# Patient Record
Sex: Male | Born: 1990 | Race: White | Hispanic: No | Marital: Married
Health system: Midwestern US, Community
[De-identification: ages and names within clinical notes are randomized; demographics above are authoritative.]

## PROBLEM LIST (undated history)

## (undated) DIAGNOSIS — K047 Periapical abscess without sinus: Secondary | ICD-10-CM

## (undated) HISTORY — PX: APPENDECTOMY: SHX54

---

## 2014-09-09 ENCOUNTER — Emergency Department (HOSPITAL_COMMUNITY)
Admission: EM | Admit: 2014-09-09 | Discharge: 2014-09-09 | Disposition: A | Discharge status: Home / Self Care | Payer: No Typology Code available for payment source | Attending: Emergency Medicine | Admitting: Emergency Medicine

## 2014-09-09 ENCOUNTER — Emergency Department (HOSPITAL_COMMUNITY): Payer: No Typology Code available for payment source

## 2014-09-09 ENCOUNTER — Encounter (HOSPITAL_COMMUNITY): Payer: Self-pay | Admitting: *Deleted

## 2014-09-09 DIAGNOSIS — Z880 Allergy status to penicillin: Secondary | ICD-10-CM | POA: Insufficient documentation

## 2014-09-09 DIAGNOSIS — Y998 Other external cause status: Secondary | ICD-10-CM | POA: Diagnosis not present

## 2014-09-09 DIAGNOSIS — S00412A Abrasion of left ear, initial encounter: Secondary | ICD-10-CM | POA: Diagnosis not present

## 2014-09-09 DIAGNOSIS — S40212A Abrasion of left shoulder, initial encounter: Secondary | ICD-10-CM | POA: Diagnosis not present

## 2014-09-09 DIAGNOSIS — S82145A Nondisplaced bicondylar fracture of left tibia, initial encounter for closed fracture: Secondary | ICD-10-CM | POA: Diagnosis not present

## 2014-09-09 DIAGNOSIS — Y92410 Unspecified street and highway as the place of occurrence of the external cause: Secondary | ICD-10-CM | POA: Insufficient documentation

## 2014-09-09 DIAGNOSIS — Z87891 Personal history of nicotine dependence: Secondary | ICD-10-CM | POA: Insufficient documentation

## 2014-09-09 DIAGNOSIS — Y9389 Activity, other specified: Secondary | ICD-10-CM | POA: Insufficient documentation

## 2014-09-09 DIAGNOSIS — S82142A Displaced bicondylar fracture of left tibia, initial encounter for closed fracture: Secondary | ICD-10-CM

## 2014-09-09 DIAGNOSIS — S8992XA Unspecified injury of left lower leg, initial encounter: Secondary | ICD-10-CM | POA: Diagnosis present

## 2014-09-09 MED ORDER — IBUPROFEN 600 MG PO TABS: 600.0000 mg | ORAL_TABLET | Freq: Four times a day (QID) | ORAL | Status: DC | PRN

## 2014-09-09 MED ORDER — IBUPROFEN 400 MG PO TABS
600.0000 mg | ORAL_TABLET | Freq: Once | ORAL | Status: AC
  Administered 2014-09-09: 600 mg via ORAL
  Filled 2014-09-09: qty 2

## 2014-09-09 MED ORDER — OXYCODONE-ACETAMINOPHEN 5-325 MG PO TABS: 1.0000 | ORAL_TABLET | ORAL | Status: DC | PRN

## 2014-09-09 MED ORDER — OXYCODONE-ACETAMINOPHEN 5-325 MG PO TABS
1.0000 | ORAL_TABLET | Freq: Once | ORAL | Status: AC
  Administered 2014-09-09: 1 via ORAL
  Filled 2014-09-09: qty 1

## 2014-09-09 MED ORDER — OXYCODONE-ACETAMINOPHEN 5-325 MG PO TABS
1.0000 | ORAL_TABLET | Freq: Once | ORAL | Status: AC
Start: 2014-09-09 — End: 2014-09-09
  Administered 2014-09-09: 1 via ORAL
  Filled 2014-09-09: qty 1
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
## 2014-09-09 NOTE — Discharge Instructions (Signed)
Tibial Plateau Fracture with Rehab The tibial plateau is the top surface of the shinbone (tibia) and is part of the knee joint. A tibial plateau fracture is a break that occurs in this portion of the shinbone. Tibial plateau fractures are fairly common. This injury is also known as a "bumper injury," because it often occurs when a person is hit by the bumper of a car.  SYMPTOMS   Severe pain at the fracture site, at the time of injury, which may continue over a period of time.  Tenderness, inflammation, and/or bruising (contusion) over the fracture site.  Decreased knee function.  Inability to stand or walk on the injured leg.  Visible deformity, if the bone fragments are not properly aligned (displaced fracture).  Signs of vascular damage: numbness and coldness below the injury site. CAUSES  Tibial plateau fractures occur when a force is placed on the bone that is greater than it can withstand. Common causes of injury include:  Direct hit (trauma) to the tibial plateau.  Indirect trauma, such as a twisting or bending injury. RISK INCREASES WITH:  Contact sports (football, rugby, lacrosse).  Motor sports.  Bone disease (osteoporosis, bone tumor).  Metabolism disorders, hormone problems, nutritional deficiency, or eating disorders (anorexia, bulimia).  Poor strength and flexibility. PREVENTION  Warm up and stretch properly before activity.  Maintain physical fitness:  Strength, flexibility, and endurance.  Cardiovascular fitness.  Wear properly fitted and padded protective equipment (i.e. shin guards for soccer). PROGNOSIS  If treated properly, tibial plateau fractures usually heal.  RELATED COMPLICATIONS   Fracture fails to heal (nonunion).  Fracture heals in a poor position (malunion).  Bleeding within the leg that leads to the squeezing of nerves inside a closed space (compartment syndrome), causing injury to the nerves or vessels in the leg.  Shortening of the  injured bones.  Shinbone growth stopping in children.  Infection, if the skin is broken over the fracture site (open fracture).  Arthritis of the knee.  Longer healing time, if not properly treated or re-injured.  Stiff knee.  Risks of surgery: infection, bleeding, nerve damage, or damage to surrounding tissues. TREATMENT Treatment first involves the use of ice and medicine to reduce pain and inflammation. If the bone fragments are out of alignment (displaced fracture), immediate realignment of the bone (reduction) by a trained professional is required. Fractures that cannot be realigned by hand, or are open (bones poking through the skin), may require surgery to hold the fracture in place with screws, pins, and plates. Once the bone is aligned, the knee should be restrained to allow for healing. After restraint, it is important to perform strengthening and stretching exercises to help regain strength and a full range of motion. These exercises may be completed at home or with a therapist.  MEDICATION   If pain medicine is needed, nonsteroidal anti-inflammatory medicines (aspirin and ibuprofen), or other minor pain relievers (acetaminophen), are often recommended.  Do not take pain medicine for 7 days before surgery.  Prescription pain relievers may be given if your caregiver thinks they are needed. Use only as directed and only as much as you need. COLD THERAPY  Cold treatment (icing) relieves pain and reduces inflammation. Cold treatment should be applied for 10 to 15 minutes every 2 to 3 hours, and immediately after activity that aggravates your symptoms. Use ice packs or an ice massage. SEEK MEDICAL CARE IF:  Treatment does not seem to help, or the condition gets worse.  Any medicines produce negative  side effects.  Any complications from surgery occur:  Pain, numbness, or coldness in the affected leg.  Discoloration under the toenails (blue or gray) of the affected  leg.  Signs of infection (fever, pain, inflammation, redness, or persistent bleeding).
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
## 2014-09-13 ENCOUNTER — Telehealth: Payer: Self-pay | Admitting: Orthopedic Surgery

## 2014-09-13 NOTE — Telephone Encounter (Signed)
Called patient, scheduled appointment
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
## 2014-09-13 NOTE — Telephone Encounter (Signed)
Bring in anyway

## 2014-09-14 ENCOUNTER — Encounter: Payer: Self-pay | Admitting: Orthopedic Surgery

## 2014-09-14 ENCOUNTER — Ambulatory Visit (INDEPENDENT_AMBULATORY_CARE_PROVIDER_SITE_OTHER): Payer: Self-pay | Admitting: Orthopedic Surgery

## 2014-09-14 VITALS — BP 115/78 | Ht 71.0 in | Wt 165.0 lb

## 2014-09-14 DIAGNOSIS — S82142A Displaced bicondylar fracture of left tibia, initial encounter for closed fracture: Secondary | ICD-10-CM

## 2014-09-14 DIAGNOSIS — S82122A Displaced fracture of lateral condyle of left tibia, initial encounter for closed fracture: Secondary | ICD-10-CM

## 2014-09-14 MED ORDER — HYDROCODONE-ACETAMINOPHEN 10-325 MG PO TABS: 1.0000 | ORAL_TABLET | ORAL | Status: DC | PRN

## 2014-09-14 MED ORDER — IBUPROFEN 800 MG PO TABS: 800.0000 mg | ORAL_TABLET | Freq: Three times a day (TID) | ORAL | Status: DC
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
## 2014-09-21 ENCOUNTER — Ambulatory Visit (INDEPENDENT_AMBULATORY_CARE_PROVIDER_SITE_OTHER): Payer: Self-pay

## 2014-09-21 ENCOUNTER — Encounter: Payer: Self-pay | Admitting: Orthopedic Surgery

## 2014-09-21 ENCOUNTER — Other Ambulatory Visit: Payer: Self-pay | Admitting: Orthopedic Surgery

## 2014-09-21 ENCOUNTER — Other Ambulatory Visit: Payer: Self-pay | Admitting: *Deleted

## 2014-09-21 ENCOUNTER — Ambulatory Visit (INDEPENDENT_AMBULATORY_CARE_PROVIDER_SITE_OTHER): Payer: Self-pay | Admitting: Orthopedic Surgery

## 2014-09-21 VITALS — BP 124/83 | Ht 71.0 in | Wt 165.0 lb

## 2014-09-21 DIAGNOSIS — S82892D Other fracture of left lower leg, subsequent encounter for closed fracture with routine healing: Secondary | ICD-10-CM

## 2014-09-21 DIAGNOSIS — S82141D Displaced bicondylar fracture of right tibia, subsequent encounter for closed fracture with routine healing: Secondary | ICD-10-CM

## 2014-09-21 NOTE — Patient Instructions (Signed)
SURGERY 09/23/14 OTIF LEFT TIBIAL PLATEAU  NOTE FOR COURT  OOW NOTE 12 WEEKS FROM 09/09/14

## 2014-09-21 NOTE — Progress Notes (Signed)
Fracture care follow-up visit  The patient came in for a post fracture x-ray of his tibial plateau with 10 caudal tilt views E Sarti had a CT scan and I reviewed his studies with Dr. Carola FrostHandy [place]umat[place] and he has suggested that the patient would do better at his age with an elevation of his depressed tibial plateau fracture and plate fixation and I agree with him. I've explained this to the patient  Upon reexamination of his knee has no instability on lateral valgus stress test he still has pain and tenderness swelling has gone down significantly  I've explained the risks and benefits of surgery versus not surgery and have advised him on the surgical procedure with subsequent need for hardware removal may be necessary but we advise he keeps it and otherwise it's causing him any problems. We discussed infection posttraumatic arthritis etc.  Surgery  Friday, June 10. Synthes butress plate and bone graft substitute I will speak with the rep. He needs to have a buttress plate.

## 2014-09-22 ENCOUNTER — Encounter (HOSPITAL_COMMUNITY)
Admission: RE | Admit: 2014-09-22 | Discharge: 2014-09-22 | Disposition: A | Discharge status: Home / Self Care | Payer: Medicaid Other | Source: Ambulatory Visit | Attending: Orthopedic Surgery | Admitting: Orthopedic Surgery

## 2014-09-22 ENCOUNTER — Encounter (HOSPITAL_COMMUNITY): Payer: Self-pay

## 2014-09-22 DIAGNOSIS — Z87891 Personal history of nicotine dependence: Secondary | ICD-10-CM | POA: Diagnosis not present

## 2014-09-22 DIAGNOSIS — S82142A Displaced bicondylar fracture of left tibia, initial encounter for closed fracture: Secondary | ICD-10-CM | POA: Diagnosis not present

## 2014-09-22 LAB — CBC
HCT: 45.2 % (ref 39.0–52.0)
Hemoglobin: 16.2 g/dL (ref 13.0–17.0)
MCH: 29.9 pg (ref 26.0–34.0)
MCHC: 35.8 g/dL (ref 30.0–36.0)
MCV: 83.5 fL (ref 78.0–100.0)
Platelets: 213 10*3/uL (ref 150–400)
RBC: 5.41 MIL/uL (ref 4.22–5.81)
RDW: 12.1 % (ref 11.5–15.5)
WBC: 9.8 10*3/uL (ref 4.0–10.5)

## 2014-09-22 LAB — BASIC METABOLIC PANEL
Anion gap: 11 (ref 5–15)
BUN: 13 mg/dL (ref 6–20)
CALCIUM: 9.6 mg/dL (ref 8.9–10.3)
CHLORIDE: 96 mmol/L — AB (ref 101–111)
CO2: 27 mmol/L (ref 22–32)
Creatinine, Ser: 1.11 mg/dL (ref 0.61–1.24)
GFR calc Af Amer: >60 mL/min (ref >60)
GFR calc non Af Amer: >60 mL/min (ref >60)
Glucose, Bld: 84 mg/dL (ref 65–99)
Potassium: 4.4 mmol/L (ref 3.5–5.1)
Sodium: 134 mmol/L — ABNORMAL LOW (ref 135–145)

## 2014-09-22 NOTE — Pre-Procedure Instructions (Addendum)
    Rain Curl  09/22/2014      [facility] [place], [place] - [place], [place] - [address] [place] [address] Phone: [phone] Fax: [phone]    Your procedure is scheduled on 09/23/2014.  Report to [name] at 7:30 A.M.  Call this number if you have problems the morning of surgery:  [phone]   Remember:  Do not eat food or drink liquids after midnight.  Take these medicines the morning of surgery with A SIP OF WATER none   Do not wear jewelry, make-up or nail polish.  Do not wear lotions, powders, or perfumes.  You may wear deodorant.  Do not shave 48 hours prior to surgery.  Men may shave face and neck.  Do not bring valuables to the hospital.  [facility] is not responsible for any belongings or valuables.  Contacts, dentures or bridgework may not be worn into surgery.  Leave your suitcase in the car.  After surgery it may be brought to your room.  For patients admitted to the hospital, discharge time will be determined by your treatment team.  Patients discharged the day of surgery will not be allowed to drive home.   Name and phone number of your driver:   family Special instructions:  n/a  Please read over the following fact sheets that you were given. Care and Recovery After Surgery

## 2014-09-22 NOTE — Patient Instructions (Signed)
    Gabriel Skinner  09/22/2014     @PREFPERIOPPHARMACY @   Your procedure is scheduled on 09/23/2014.  Report to [name] at 7:30 A.M.  Call this number if you have problems the morning of surgery:  [phone]   Remember:  Do not eat food or drink liquids after midnight.  Take these medicines the morning of surgery with A SIP OF WATER none   Do not wear jewelry, make-up or nail polish.  Do not wear lotions, powders, or perfumes.  You may wear deodorant.  Do not shave 48 hours prior to surgery.  Men may shave face and neck.  Do not bring valuables to the hospital.  [facility] is not responsible for any belongings or valuables.  Contacts, dentures or bridgework may not be worn into surgery.  Leave your suitcase in the car.  After surgery it may be brought to your room.  For patients admitted to the hospital, discharge time will be determined by your treatment team.  Patients discharged the day of surgery will not be allowed to drive home.   Name and phone number of your driver:   familySpecial instructions:  na  Please read over the following fact sheets that you were given. Care and Recovery After Surgery
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
## 2014-09-23 ENCOUNTER — Encounter (HOSPITAL_COMMUNITY): Payer: Self-pay | Admitting: *Deleted

## 2014-09-23 ENCOUNTER — Ambulatory Visit (HOSPITAL_COMMUNITY): Payer: Medicaid Other | Admitting: Anesthesiology

## 2014-09-23 ENCOUNTER — Observation Stay (HOSPITAL_COMMUNITY)
Admission: RE | Admit: 2014-09-23 | Discharge: 2014-09-24 | Disposition: A | Discharge status: Home / Self Care | Payer: Medicaid Other | Source: Ambulatory Visit | Attending: Orthopedic Surgery | Admitting: Orthopedic Surgery

## 2014-09-23 ENCOUNTER — Ambulatory Visit (HOSPITAL_COMMUNITY): Payer: Medicaid Other

## 2014-09-23 ENCOUNTER — Encounter (HOSPITAL_COMMUNITY)
Admission: RE | Disposition: A | Discharge status: Home / Self Care | Payer: Self-pay | Source: Ambulatory Visit | Attending: Orthopedic Surgery

## 2014-09-23 DIAGNOSIS — R52 Pain, unspecified: Secondary | ICD-10-CM | POA: Diagnosis present

## 2014-09-23 DIAGNOSIS — Z87891 Personal history of nicotine dependence: Secondary | ICD-10-CM | POA: Diagnosis not present

## 2014-09-23 DIAGNOSIS — S82142A Displaced bicondylar fracture of left tibia, initial encounter for closed fracture: Secondary | ICD-10-CM | POA: Diagnosis not present

## 2014-09-23 HISTORY — PX: ORIF TIBIA PLATEAU: SHX2132

## 2014-09-23 SURGERY — OPEN REDUCTION INTERNAL FIXATION (ORIF) TIBIAL PLATEAU
Anesthesia: General | Site: Leg Lower | Laterality: Left

## 2014-09-23 MED ORDER — OXYCODONE HCL 5 MG PO TABS
5.0000 mg | ORAL_TABLET | Freq: Once | ORAL | Status: AC
  Administered 2014-09-23: 5 mg via ORAL

## 2014-09-23 MED ORDER — FENTANYL CITRATE (PF) 250 MCG/5ML IJ SOLN
INTRAMUSCULAR | Status: DC | PRN
  Administered 2014-09-23 (×12): 50 ug via INTRAVENOUS

## 2014-09-23 MED ORDER — HYDROMORPHONE HCL 1 MG/ML IJ SOLN
INTRAMUSCULAR | Status: AC
Start: 2014-09-23 — End: 2014-09-23
  Filled 2014-09-23: qty 1

## 2014-09-23 MED ORDER — FENTANYL CITRATE (PF) 250 MCG/5ML IJ SOLN
INTRAMUSCULAR | Status: AC
  Filled 2014-09-23: qty 5

## 2014-09-23 MED ORDER — OXYCODONE HCL 5 MG PO TABS
10.0000 mg | ORAL_TABLET | ORAL | Status: DC
  Administered 2014-09-23 – 2014-09-24 (×5): 10 mg via ORAL
  Filled 2014-09-23 (×5): qty 2

## 2014-09-23 MED ORDER — CHLORHEXIDINE GLUCONATE 4 % EX LIQD: 60.0000 mL | Freq: Once | CUTANEOUS | Status: DC

## 2014-09-23 MED ORDER — HYDROMORPHONE HCL 1 MG/ML IJ SOLN
0.5000 mg | Freq: Once | INTRAMUSCULAR | Status: AC
  Administered 2014-09-23: 0.5 mg via INTRAVENOUS

## 2014-09-23 MED ORDER — IBUPROFEN 800 MG PO TABS
800.0000 mg | ORAL_TABLET | Freq: Four times a day (QID) | ORAL | Status: DC
  Administered 2014-09-23 – 2014-09-24 (×4): 800 mg via ORAL
  Filled 2014-09-23 (×4): qty 1

## 2014-09-23 MED ORDER — CELECOXIB 400 MG PO CAPS
400.0000 mg | ORAL_CAPSULE | Freq: Once | ORAL | Status: AC
  Administered 2014-09-23: 400 mg via ORAL

## 2014-09-23 MED ORDER — MORPHINE SULFATE 4 MG/ML IJ SOLN: 4.0000 mg | INTRAMUSCULAR | Status: DC | PRN

## 2014-09-23 MED ORDER — ROCURONIUM BROMIDE 100 MG/10ML IV SOLN
INTRAVENOUS | Status: DC | PRN
  Administered 2014-09-23: 10 mg via INTRAVENOUS
  Administered 2014-09-23: 40 mg via INTRAVENOUS

## 2014-09-23 MED ORDER — OXYCODONE-ACETAMINOPHEN 5-325 MG PO TABS: 1.0000 | ORAL_TABLET | ORAL | Status: AC | PRN

## 2014-09-23 MED ORDER — PREGABALIN 50 MG PO CAPS
50.0000 mg | ORAL_CAPSULE | Freq: Once | ORAL | Status: AC
  Administered 2014-09-23: 50 mg via ORAL

## 2014-09-23 MED ORDER — SODIUM CHLORIDE 0.9 % IR SOLN
Status: DC | PRN
  Administered 2014-09-23: 1000 mL

## 2014-09-23 MED ORDER — IBUPROFEN 800 MG PO TABS: 800.0000 mg | ORAL_TABLET | Freq: Three times a day (TID) | ORAL | Status: AC

## 2014-09-23 MED ORDER — MIDAZOLAM HCL 2 MG/2ML IJ SOLN
1.0000 mg | INTRAMUSCULAR | Status: DC | PRN
  Administered 2014-09-23: 2 mg via INTRAVENOUS

## 2014-09-23 MED ORDER — OXYCODONE HCL 5 MG PO TABS
ORAL_TABLET | ORAL | Status: AC
  Filled 2014-09-23: qty 1

## 2014-09-23 MED ORDER — SODIUM CHLORIDE 0.9 % IV SOLN
INTRAVENOUS | Status: DC
  Administered 2014-09-23: 16:00:00 via INTRAVENOUS

## 2014-09-23 MED ORDER — VANCOMYCIN HCL IN DEXTROSE 1-5 GM/200ML-% IV SOLN
1000.0000 mg | INTRAVENOUS | Status: AC
  Administered 2014-09-23: 1000 mg via INTRAVENOUS

## 2014-09-23 MED ORDER — PREGABALIN 50 MG PO CAPS
ORAL_CAPSULE | ORAL | Status: AC
  Filled 2014-09-23: qty 1

## 2014-09-23 MED ORDER — SUCCINYLCHOLINE CHLORIDE 20 MG/ML IJ SOLN
INTRAMUSCULAR | Status: AC
  Filled 2014-09-23: qty 1

## 2014-09-23 MED ORDER — NICOTINE 14 MG/24HR TD PT24
14.0000 mg | MEDICATED_PATCH | Freq: Every day | TRANSDERMAL | Status: DC
  Administered 2014-09-23: 14 mg via TRANSDERMAL
  Filled 2014-09-23: qty 1

## 2014-09-23 MED ORDER — PROPOFOL 10 MG/ML IV BOLUS
INTRAVENOUS | Status: DC | PRN
  Administered 2014-09-23: 175 mg via INTRAVENOUS

## 2014-09-23 MED ORDER — ONDANSETRON HCL 4 MG/2ML IJ SOLN
INTRAMUSCULAR | Status: AC
  Filled 2014-09-23: qty 2

## 2014-09-23 MED ORDER — LACTATED RINGERS IV SOLN
INTRAVENOUS | Status: DC
  Administered 2014-09-23: 08:00:00 via INTRAVENOUS

## 2014-09-23 MED ORDER — PROMETHAZINE HCL 25 MG PO TABS: 25.0000 mg | ORAL_TABLET | Freq: Four times a day (QID) | ORAL | Status: AC | PRN

## 2014-09-23 MED ORDER — ONDANSETRON HCL 4 MG/2ML IJ SOLN
4.0000 mg | Freq: Once | INTRAMUSCULAR | Status: AC
  Administered 2014-09-23: 4 mg via INTRAVENOUS

## 2014-09-23 MED ORDER — VANCOMYCIN HCL IN DEXTROSE 1-5 GM/200ML-% IV SOLN
INTRAVENOUS | Status: AC
  Filled 2014-09-23: qty 200

## 2014-09-23 MED ORDER — ONDANSETRON HCL 4 MG/2ML IJ SOLN: 4.0000 mg | Freq: Once | INTRAMUSCULAR | Status: DC | PRN

## 2014-09-23 MED ORDER — ACETAMINOPHEN 500 MG PO TABS
1000.0000 mg | ORAL_TABLET | Freq: Four times a day (QID) | ORAL | Status: DC
  Administered 2014-09-23 – 2014-09-24 (×4): 1000 mg via ORAL
  Filled 2014-09-23 (×4): qty 2

## 2014-09-23 MED ORDER — ROCURONIUM BROMIDE 50 MG/5ML IV SOLN
INTRAVENOUS | Status: AC
  Filled 2014-09-23: qty 1

## 2014-09-23 MED ORDER — PREGABALIN 50 MG PO CAPS
50.0000 mg | ORAL_CAPSULE | Freq: Three times a day (TID) | ORAL | Status: DC
  Administered 2014-09-23 – 2014-09-24 (×3): 50 mg via ORAL
  Filled 2014-09-23 (×3): qty 1

## 2014-09-23 MED ORDER — FENTANYL CITRATE (PF) 100 MCG/2ML IJ SOLN
INTRAMUSCULAR | Status: AC
  Filled 2014-09-23: qty 2

## 2014-09-23 MED ORDER — FENTANYL CITRATE (PF) 100 MCG/2ML IJ SOLN
25.0000 ug | INTRAMUSCULAR | Status: DC | PRN
  Filled 2014-09-23: qty 2

## 2014-09-23 MED ORDER — BUPIVACAINE-EPINEPHRINE 0.5% -1:200000 IJ SOLN
INTRAMUSCULAR | Status: DC | PRN
  Administered 2014-09-23: 60 mL

## 2014-09-23 MED ORDER — LIDOCAINE HCL (CARDIAC) 20 MG/ML IV SOLN
INTRAVENOUS | Status: DC | PRN
  Administered 2014-09-23: 40 mg via INTRAVENOUS

## 2014-09-23 MED ORDER — CELECOXIB 400 MG PO CAPS
ORAL_CAPSULE | ORAL | Status: AC
  Filled 2014-09-23: qty 1

## 2014-09-23 MED ORDER — MIDAZOLAM HCL 2 MG/2ML IJ SOLN
INTRAMUSCULAR | Status: AC
  Filled 2014-09-23: qty 2

## 2014-09-23 MED ORDER — HYDROMORPHONE HCL 1 MG/ML IJ SOLN
0.5000 mg | INTRAMUSCULAR | Status: DC | PRN
  Administered 2014-09-23 (×2): 0.5 mg via INTRAVENOUS
  Filled 2014-09-23: qty 1

## 2014-09-23 MED ORDER — BUPIVACAINE-EPINEPHRINE (PF) 0.5% -1:200000 IJ SOLN
INTRAMUSCULAR | Status: AC
  Filled 2014-09-23: qty 60

## 2014-09-23 MED ORDER — LIDOCAINE HCL (PF) 1 % IJ SOLN
INTRAMUSCULAR | Status: AC
  Filled 2014-09-23: qty 5

## 2014-09-23 SURGICAL SUPPLY — 105 items
BAG HAMPER (MISCELLANEOUS) ×3 IMPLANT
BANDAGE ELASTIC 6 VELCRO NS (GAUZE/BANDAGES/DRESSINGS) ×3 IMPLANT
BANDAGE ESMARK 4X12 BL STRL LF (DISPOSABLE) ×1 IMPLANT
BANDAGE ESMARK 6X9 LF (GAUZE/BANDAGES/DRESSINGS) ×1 IMPLANT
BIT DRILL 2.5 X LONG (BIT) ×1
BIT DRILL PERC QC 2.8X200 100 (BIT) ×1 IMPLANT
BIT DRILL X LONG 2.5 (BIT) ×1 IMPLANT
BLADE 10 SAFETY STRL DISP (BLADE) IMPLANT
BLADE 15 SAFETY STRL DISP (BLADE) IMPLANT
BLADE HEX COATED 2.75 (ELECTRODE) ×3 IMPLANT
BNDG COHESIVE 4X5 TAN STRL (GAUZE/BANDAGES/DRESSINGS) ×3 IMPLANT
BNDG ESMARK 4X12 BLUE STRL LF (DISPOSABLE) ×3
BNDG ESMARK 6X9 LF (GAUZE/BANDAGES/DRESSINGS) ×3
CATH KIT ON Q 2.5IN SLV (PAIN MANAGEMENT) IMPLANT
CHIPS CORTICOCANC 10CC CC10 (Bone Implant) ×3 IMPLANT
CHLORAPREP W/TINT 26ML (MISCELLANEOUS) ×3 IMPLANT
CLOTH BEACON ORANGE TIMEOUT ST (SAFETY) ×3 IMPLANT
COOLER CRYO CUFF IC AND MOTOR (MISCELLANEOUS) IMPLANT
COVER LIGHT HANDLE STERIS (MISCELLANEOUS) ×6 IMPLANT
COVER MAYO STAND XLG (DRAPE) ×3 IMPLANT
CUFF CRYO KNEE LG 20X31 COOLER (ORTHOPEDIC SUPPLIES) IMPLANT
CUFF CRYO KNEE18X23 MED (MISCELLANEOUS) IMPLANT
CUFF TOURNIQUET SINGLE 24IN (TOURNIQUET CUFF) IMPLANT
CUFF TOURNIQUET SINGLE 34IN LL (TOURNIQUET CUFF) ×3 IMPLANT
DELIVERY NEEDLE ×3 IMPLANT
DRAIN TROCAR  MED 1/8 (DRAIN) IMPLANT
DRAPE C-ARM FOLDED MOBILE STRL (DRAPES) ×3 IMPLANT
DRAPE EXTREMITY T 121X128X90 (DRAPE) ×3 IMPLANT
DRAPE INCISE IOBAN 66X45 STRL (DRAPES) ×3 IMPLANT
DRAPE LAPAROTOMY TRNSV 102X78 (DRAPE) IMPLANT
DRAPE PROXIMA HALF (DRAPES) ×6 IMPLANT
DRILL BIT QUICK COUP 2.8MM 100 (BIT) ×2
DRILL BIT X LONG 2.5 (BIT) ×2
ELECT REM PT RETURN 9FT ADLT (ELECTROSURGICAL) ×3
ELECTRODE REM PT RTRN 9FT ADLT (ELECTROSURGICAL) ×1 IMPLANT
GAUZE SPONGE 4X4 16PLY XRAY LF (GAUZE/BANDAGES/DRESSINGS) IMPLANT
GAUZE XEROFORM 5X9 LF (GAUZE/BANDAGES/DRESSINGS) ×3 IMPLANT
GLOVE BIO SURGEON STRL SZ7 (GLOVE) ×3 IMPLANT
GLOVE BIOGEL PI IND STRL 7.0 (GLOVE) ×2 IMPLANT
GLOVE BIOGEL PI IND STRL 7.5 (GLOVE) ×1 IMPLANT
GLOVE BIOGEL PI INDICATOR 7.0 (GLOVE) ×4
GLOVE BIOGEL PI INDICATOR 7.5 (GLOVE) ×2
GLOVE ECLIPSE 7.0 STRL STRAW (GLOVE) ×3 IMPLANT
GLOVE SKINSENSE NS SZ8.0 LF (GLOVE) ×2
GLOVE SKINSENSE STRL SZ8.0 LF (GLOVE) ×1 IMPLANT
GLOVE SS N UNI LF 8.5 STRL (GLOVE) ×3 IMPLANT
GOWN STRL REUS W/TWL LRG LVL3 (GOWN DISPOSABLE) ×3 IMPLANT
GOWN STRL REUS W/TWL XL LVL3 (GOWN DISPOSABLE) ×3 IMPLANT
IMMOBILIZER KNEE 19 UNV (ORTHOPEDIC SUPPLIES) ×3 IMPLANT
INST SET MINOR BONE (KITS) ×3 IMPLANT
K-WIRE 229MX1.6 (WIRE) IMPLANT
K-WIRE SGL PT/SMTH 9X35 (WIRE)
KIT BLADEGUARD II DBL (SET/KITS/TRAYS/PACK) ×3 IMPLANT
KIT ROOM TURNOVER APOR (KITS) ×3 IMPLANT
KWIRE SGL PT/SMTH 9X35 (WIRE) IMPLANT
KWIRE SGL PT/SMTH 9X45IN (WIRE) IMPLANT
MANIFOLD NEPTUNE II (INSTRUMENTS) ×3 IMPLANT
MARKER SKIN DUAL TIP RULER LAB (MISCELLANEOUS) ×3 IMPLANT
NEEDLE HYPO 21X1.5 SAFETY (NEEDLE) ×3 IMPLANT
NEEDLE MA TROC 1/2 (NEEDLE) IMPLANT
NS IRRIG 1000ML POUR BTL (IV SOLUTION) ×3 IMPLANT
PACK BASIC III (CUSTOM PROCEDURE TRAY) ×2
PACK SRG BSC III STRL LF ECLPS (CUSTOM PROCEDURE TRAY) ×1 IMPLANT
PAD ABD 5X9 TENDERSORB (GAUZE/BANDAGES/DRESSINGS) IMPLANT
PAD ARMBOARD 7.5X6 YLW CONV (MISCELLANEOUS) ×3 IMPLANT
PAD CAST 4YDX4 CTTN HI CHSV (CAST SUPPLIES) IMPLANT
PADDING CAST COTTON 4X4 STRL (CAST SUPPLIES)
PADDING CAST COTTON 6X4 STRL (CAST SUPPLIES) IMPLANT
PADDING WEBRIL 6 STERILE (GAUZE/BANDAGES/DRESSINGS) ×3 IMPLANT
PASSER SUT SWANSON 36MM LOOP (INSTRUMENTS) IMPLANT
PENCIL HANDSWITCHING (ELECTRODE) ×3 IMPLANT
PLATE PROX TIB LT 3.5 VA-LCP (Plate) ×3 IMPLANT
PUTTY DBX 5CC (Putty) ×3 IMPLANT
SCREW CORTEX 3.5 32MM (Screw) ×2 IMPLANT
SCREW CORTEX 3.5 40MM (Screw) ×2 IMPLANT
SCREW CORTEX 3.5X75MM (Screw) ×3 IMPLANT
SCREW LOCK 3.5X85 (Screw) ×6 IMPLANT
SCREW LOCK CORT ST 3.5X32 (Screw) ×1 IMPLANT
SCREW LOCK CORT ST 3.5X40 (Screw) ×1 IMPLANT
SCREW LOCKING 3.5X80MM VA (Screw) ×3 IMPLANT
SCREW LOCKING VA 3.5X75MM (Screw) ×3 IMPLANT
SCREW LOCKING VA 3.5X85MM (Screw) ×3 IMPLANT
SET BASIN LINEN APH (SET/KITS/TRAYS/PACK) ×3 IMPLANT
SPLINT IMMOBILIZER J 3INX20FT (CAST SUPPLIES)
SPLINT J IMMOBILIZER 3X20FT (CAST SUPPLIES) IMPLANT
SPONGE GAUZE 4X4 12PLY (GAUZE/BANDAGES/DRESSINGS) ×3 IMPLANT
SPONGE LAP 18X18 X RAY DECT (DISPOSABLE) ×3 IMPLANT
SPONGE LAP 4X18 X RAY DECT (DISPOSABLE) IMPLANT
STAPLER VISISTAT 35W (STAPLE) IMPLANT
STOCKINETTE IMPERVIOUS LG (DRAPES) ×3 IMPLANT
SUT CHROMIC 0 CTX 36 (SUTURE) IMPLANT
SUT ETHIBOND NAB OS 4 #2 30IN (SUTURE) ×9 IMPLANT
SUT MON AB 0 CT1 (SUTURE) ×6 IMPLANT
SUT MON AB 2-0 CT1 36 (SUTURE) ×3 IMPLANT
SUT MON AB 2-0 SH 27 (SUTURE)
SUT MON AB 2-0 SH27 (SUTURE) IMPLANT
SUT NUROLON CT 2 BLK #1 18IN (SUTURE) IMPLANT
SUT PLAIN 2 0 XLH (SUTURE) IMPLANT
SUT PLAIN 3 0 FS2 (SUTURE) IMPLANT
SUT SILK 0 FSL (SUTURE) IMPLANT
SUT VIC AB 1 CT1 27 (SUTURE)
SUT VIC AB 1 CT1 27XBRD ANTBC (SUTURE) IMPLANT
SYR 30ML LL (SYRINGE) ×3 IMPLANT
SYR BULB IRRIGATION 50ML (SYRINGE) ×6 IMPLANT
YANKAUER SUCT 12FT TUBE ARGYLE (SUCTIONS) ×3 IMPLANT

## 2014-09-23 NOTE — Progress Notes (Signed)
Just monitoring at this time

## 2014-09-23 NOTE — Anesthesia Postprocedure Evaluation (Signed)
  Anesthesia Post-op Note  Patient: Gabriel Skinner  Procedure(s) Performed: Procedure(s): OPEN REDUCTION INTERNAL FIXATION LEFT TIBIAL PLATEAU AND GRAFTING (Left)  Patient Location: PACU  Anesthesia Type:General  Level of Consciousness: awake, alert  and oriented  Airway and Oxygen Therapy: Patient Spontanous Breathing  Post-op Pain: moderate  Post-op Assessment: Post-op Vital signs reviewed, Patient's Cardiovascular Status Stable, Respiratory Function Stable, Patent Airway and No signs of Nausea or vomiting              Post-op Vital Signs: Reviewed and stable  Last Vitals:  Filed Vitals:   09/23/14 1215  BP: 177/91  Pulse: 82  Temp:   Resp: 12    Complications: No apparent anesthesia complications
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
## 2014-09-23 NOTE — Progress Notes (Signed)
Reassessed after pacu meds given  still having pain 6/10  "shin aches"  Compartments checked (fasciotomy done intraop) soft normal neuro and vascualar exam  Plan observation for pain control

## 2014-09-23 NOTE — OR Nursing (Signed)
Operative leg noted to have t sore like areas to lower portion of leg.  One anterior   And one posterior.  Patient stated he thought it might be bug bites.Marland Kitchen

## 2014-09-23 NOTE — Interval H&P Note (Signed)
History and Physical Interval Note:  09/23/2014 8:41 AM  Gabriel Skinner  has presented today for surgery, with the diagnosis of split depressed tib plateau fracture  The various methods of treatment have been discussed with the patient and family. After consideration of risks, benefits and other options for treatment, the patient has consented to  Procedure(s): OPEN REDUCTION INTERNAL FIXATION (ORIF) possible grafting LEFT TIBIAL PLATEAU  as a surgical intervention .  The patient's history has been reviewed, patient examined, the patient's tense effusion has resolved. He still has pain with valgus stress about instability, stable for surgery.  I have reviewed the patient's chart and labs.  Questions were answered to the patient's satisfaction.     Fuller Canada

## 2014-09-23 NOTE — Anesthesia Preprocedure Evaluation (Signed)
Anesthesia Evaluation  Patient identified by MRN, date of birth, ID band Patient awake    Reviewed: Allergy & Precautions, NPO status , Patient's Chart, lab work & pertinent test results  Airway Mallampati: I  TM Distance: >3 FB     Dental  (+) Teeth Intact, Dental Advisory Given   Pulmonary former smoker,  breath sounds clear to auscultation        Cardiovascular negative cardio ROS  Rhythm:Regular Rate:Normal     Neuro/Psych negative neurological ROS     GI/Hepatic negative GI ROS,   Endo/Other    Renal/GU      Musculoskeletal   Abdominal   Peds  Hematology   Anesthesia Other Findings   Reproductive/Obstetrics                             Anesthesia Physical Anesthesia Plan  ASA: I  Anesthesia Plan: General   Post-op Pain Management:    Induction: Intravenous  Airway Management Planned: Oral ETT  Additional Equipment:   Intra-op Plan:   Post-operative Plan: Extubation in OR  Informed Consent: I have reviewed the patients History and Physical, chart, labs and discussed the procedure including the risks, benefits and alternatives for the proposed anesthesia with the patient or authorized representative who has indicated his/her understanding and acceptance.     Plan Discussed with:   Anesthesia Plan Comments:         Anesthesia Quick Evaluation

## 2014-09-23 NOTE — OR Nursing (Signed)
Typo noted in previous note. Two areas of sore like area to lower leg.

## 2014-09-23 NOTE — Anesthesia Procedure Notes (Signed)
Procedure Name: Intubation Date/Time: 09/23/2014 9:32 AM Performed by: Glynn Octave E Pre-anesthesia Checklist: Patient identified, Patient being monitored, Timeout performed, Emergency Drugs available and Suction available Patient Re-evaluated:Patient Re-evaluated prior to inductionOxygen Delivery Method: Circle System Utilized Preoxygenation: Pre-oxygenation with 100% oxygen Intubation Type: IV induction Ventilation: Mask ventilation without difficulty Laryngoscope Size: Mac and 3 Grade View: Grade I Tube type: Oral Tube size: 7.0 mm Number of attempts: 1 Airway Equipment and Method: Stylet Placement Confirmation: ETT inserted through vocal cords under direct vision,  positive ETCO2 and breath sounds checked- equal and bilateral Secured at: 21 cm Tube secured with: Tape Dental Injury: Teeth and Oropharynx as per pre-operative assessment

## 2014-09-23 NOTE — Progress Notes (Signed)
Dr Romeo Apple aware of patient still with high pan level will decide whether to keep patient or let him go home

## 2014-09-23 NOTE — Transfer of Care (Signed)
Immediate Anesthesia Transfer of Care Note  Patient: Gabriel Skinner  Procedure(s) Performed: Procedure(s): OPEN REDUCTION INTERNAL FIXATION LEFT TIBIAL PLATEAU AND GRAFTING (Left)  Patient Location: PACU  Anesthesia Type:General  Level of Consciousness: awake  Airway & Oxygen Therapy: Patient Spontanous Breathing and Patient connected to face mask oxygen  Post-op Assessment: Report given to RN  Post vital signs: Reviewed and stable  Last Vitals:  Filed Vitals:   09/23/14 0905  BP: 119/76  Pulse:   Temp:   Resp: 14    Complications: No apparent anesthesia complications

## 2014-09-23 NOTE — Brief Op Note (Signed)
09/23/2014  11:29 AM  PATIENT:  Gabriel Skinner  24 y.o. male  PRE-OPERATIVE DIAGNOSIS:  split depressed tibial plateau fracture  POST-OPERATIVE DIAGNOSIS:  split depressed tibial plateau fracture  Surgical findings  There is no instability on varus valgus stress testing or AP stress testing prior to surgery with the patient under anesthesia  Depression of the tibial plateau with split fracture of the lateral plateau  PROCEDURE:  Procedure(s): OPEN REDUCTION INTERNAL FIXATION LEFT TIBIAL PLATEAU AND GRAFTING (Left)   Graft: dbx 5cc and cancellous chips 10cc  SURGEON:  Surgeon(s) and Role:    * Vickki Hearing, MD - Primary  PHYSICIAN ASSISTANT:   ASSISTANTS: betty ashley   ANESTHESIA:   general  EBL:  Total I/O In: 900 [I.V.:900] Out: 50 [Blood:50]  BLOOD ADMINISTERED:none  DRAINS: none   LOCAL MEDICATIONS USED:  MARCAINE     SPECIMEN:  No Specimen  DISPOSITION OF SPECIMEN:  N/A  COUNTS:  YES  TOURNIQUET:   Total Tourniquet Time Documented: area (laterality) - 97 minutes Total: area (laterality) - 97 minutes   DICTATION: .Dragon Dictation  PLAN OF CARE: Discharge to home after PACU  PATIENT DISPOSITION:  PACU - hemodynamically stable.   Delay start of Pharmacological VTE agent (>24hrs) due to surgical blood loss or risk of bleeding: not applicable

## 2014-09-24 DIAGNOSIS — S82142A Displaced bicondylar fracture of left tibia, initial encounter for closed fracture: Secondary | ICD-10-CM | POA: Diagnosis not present

## 2014-09-24 NOTE — Discharge Summary (Signed)
Physician Discharge Summary  Patient ID: Gabriel Skinner MRN: [id number] DOB/AGE: 07-Dec-1990 24 y.o.  Admit date: 09/23/2014 Discharge date: 09/24/2014  Admission Diagnoses: ORIF LEFT TIBIAL PLATEAU FRACTURE, UNCONTROLLED PAIN   Discharge Diagnoses: SAME  Active Problems:   Pain   Discharged Condition: good  Hospital Course: SURGERY FOR LEFT TIB PLATEAU, WENT WELL, IN PACU COULDN'T GET PAIN CONTROLLED PLACED ON OBSERVATION. DID WELL WITH PO MEDS Discharge Exam: Blood pressure 147/84, pulse 79, temperature 97.8 F (36.6 C), temperature source Oral, resp. rate 17, height 5\' 11"  (1.803 m), weight 178 lb (80.74 kg), SpO2 100 %. COMPARTMENTS SOFT, SENSATION IN FOOT NORMAL NO PAIN ON PASSIVE STRETCH  Disposition: 01-Home or Self Care  Discharge Instructions    Diet - low sodium heart healthy    Complete by:  As directed      Increase activity slowly    Complete by:  As directed             Medication List    STOP taking these medications        HYDROcodone-acetaminophen 10-325 MG per tablet  Commonly known as:  NORCO      TAKE these medications        ibuprofen 800 MG tablet  Commonly known as:  ADVIL,MOTRIN  Take 1 tablet (800 mg total) by mouth 3 (three) times daily.     oxyCODONE-acetaminophen 5-325 MG per tablet  Commonly known as:  ROXICET  Take 1 tablet by mouth every 4 (four) hours as needed for severe pain.     promethazine 25 MG tablet  Commonly known as:  PHENERGAN  Take 1 tablet (25 mg total) by mouth every 6 (six) hours as needed for nausea or vomiting.           Follow-up Information    Follow up with Fuller Canada, MD. Go on 09/28/2014.   Specialties:  Orthopedic Surgery, Radiology   Why:  For wound re-check   Contact information:   [address] [place] [facility] [address] [phone]       Follow up with Fuller Canada, MD On 09/28/2014.   Specialties:  Orthopedic Surgery, Radiology   Why:  For wound re-check   Contact  information:   [address] [place] [facility] [address] [phone]      DRESSING CHANGE WEDS Signed: Fuller Canada 09/24/2014, 8:21 AM

## 2014-09-24 NOTE — Progress Notes (Signed)
Patient being d/c home with prescriptions. IV cath removed and intact. Patient ambulated to the bathroom. Tolerates well with minimal pain. States pain 3/10. No c/o at this time. Family at bedside.

## 2014-09-24 NOTE — Discharge Instructions (Signed)
Tibial Plateau Fracture, Displaced, Adult °You have a fracture (break in bone) of your tibial plateau. This is a fracture in the upper part of the large "shin" bone (tibia) in your lower leg. The plateau is the joint surface that buts up against the femur (thigh bone of your upper leg). This is what makes up your knee joint. Displaced means that a portion of the fracture is out of place from where the bone is supposed to be. Because this fracture goes into the knee joint, it is necessary that this fracture be fixed in the best position possible. This fracture must be fixed surgically. This means an operation must be done to get the bones into the best possible position for healing. Otherwise, this fracture can cause severe arthritis and marked disability over the years. This is likely to occur even with the best treatment. These fractures are generally diagnosed with x-rays. Often a CT scan is needed to identify the fracture fragments and the degree of displacement. °TREATMENT  °Your fracture will be reduced (bones fragments are put back into position) and held in place with hardware (fixation devices). When your caregiver feels the fracture is healed well enough, you may begin range of motion exercises to keep your knee limber (moving well). This may be very difficult at first. It is necessary to follow the instructions of all your caregivers, your surgeon, and physical therapist following surgery. °LET YOUR CAREGIVER KNOW ABOUT: °· Allergies °· Medications taken including herbs, eye drops, over the counter medications, and creams °· Use of steroids (by mouth or creams) °· History of bleeding or blood problems °· Previous problems with anesthetics or novocaine °· Possibility of pregnancy, if this applies °· History of blood clots (thrombophlebitis) °· Previous surgery °· Other health problems °RISKS AND COMPLICATIONS °All surgery is associated with risks. Some of these risks are: °· Excessive  bleeding. °· Infection. °· Failure to heal properly resulting in an unstable knee. °· Stiffness of knee following repair. °· Need to remove the hardware. °BEFORE AND AFTER YOUR PROCEDURE °Prior to surgery, an IV (intravenous line connected to your vein for giving fluids) may be started. You will also be given an anesthetic. These are medicines and gas to make you sleep. You may also be given regional anesthesia such as a spinal or epidural anesthetic. After surgery, you will be taken to the recovery area where a nurse will monitor your progress. You may have a catheter (a long, narrow, hollow tube) in your bladder following surgery that helps you pass your water. When you are awake, are stable, taking fluids well, and without complications, you will be returned to your room. You will receive physical therapy and other care until you are doing well and your caregiver feels it is safe for you to be transferred either to home or to an extended care facility. °HOME CARE INSTRUCTIONS  °· You may resume normal diet and activities as directed or allowed. °· Keep ice packs (a bag of ice wrapped in a towel) on the surgical area for twenty minutes, four times per day, for the first two days following surgery. Use ice only if OK with your surgeon or caregiver. °· Change dressings if necessary or as directed. °· If you have a plaster or fiberglass cast: °¨ Do not try to scratch the skin under the cast using sharp or pointed objects. °¨ Check the skin around the cast every day. You may put lotion on any red or sore areas. °¨ Keep your   cast dry and clean.  Do not put pressure on any part of your cast or splint until it is fully hardened.  Your cast or splint can be protected during bathing with a plastic bag. Do not lower the cast or splint into water.  Only take over-the-counter or prescription medicines for pain, discomfort, or fever as directed by your caregiver.  Use crutches as directed and do not exercise leg unless  instructed.  Keep toes and ankles moving frequently if they are not immobilized in your splint or cast  Keep your leg elevated above your heart as much as possible during the first 24-48 hours after your surgery  These are not fractures to be taken lightly! If these bones become displaced and get out of position, it may eventually lead to arthritis and disability for the rest of your life. Problems often follow even the best of care. Follow the directions of your caregiver.  Keep appointments as directed. SEEK IMMEDIATE MEDICAL CARE IF:   There is redness, swelling, or increasing pain in the wound.  There is pus coming from wound.  An unexplained oral temperature above 102 F (38.9 C) develops.  You develop a bad smell coming from the wound or dressing.  There is a breaking open of the wound (edges not staying together) after sutures or staples have been removed.  You develop severe pain in the injured leg.  You begin to lose feeling in your foot or toes. Especially if someone else moving your toes becomes increasingly painful  You develop a cold or blue foot or toes on the injured side. If you do not have a window in your cast for observing the wound, a discharge or minor bleeding may show up as a stain on the outside of your cast or caster splint. Report these findings to your caregiver. Document Released: 12/22/2001 Document Revised: 06/24/2011 Document Reviewed: 06/23/2007 [facility] Patient Information 2015 [place], [place]. This information is not intended to replace advice given to you by your health care provider. Make sure you discuss any questions you have with your health care provider. NO WEIGHT BEARING ON LEFT LEG

## 2014-09-27 ENCOUNTER — Encounter (HOSPITAL_COMMUNITY): Payer: Self-pay | Admitting: Orthopedic Surgery

## 2014-09-28 ENCOUNTER — Encounter: Payer: Self-pay | Admitting: Orthopedic Surgery

## 2014-09-28 ENCOUNTER — Ambulatory Visit (INDEPENDENT_AMBULATORY_CARE_PROVIDER_SITE_OTHER): Payer: Self-pay | Admitting: Orthopedic Surgery

## 2014-09-28 VITALS — BP 126/88 | Ht 71.0 in | Wt 178.0 lb

## 2014-09-28 DIAGNOSIS — S82141D Displaced bicondylar fracture of right tibia, subsequent encounter for closed fracture with routine healing: Secondary | ICD-10-CM

## 2014-09-28 DIAGNOSIS — Z4789 Encounter for other orthopedic aftercare: Secondary | ICD-10-CM

## 2014-09-28 NOTE — Progress Notes (Signed)
Patient ID: Gabriel Skinner, male   DOB: February 15, 1991, 24 y.o.   MRN: [id number] Chief Complaint  Patient presents with  . Follow-up    Dressing change on left leg, DOS 09-23-14.    BP 126/88 mmHg  Ht 5\' 11"  (1.803 m)  Wt 178 lb (80.74 kg)  BMI 24.84 kg/m2  Encounter Diagnoses  Name Primary?  . Tibial plateau fracture, right, closed, with routine healing, subsequent encounter   . Aftercare following surgery of the musculoskeletal system Yes    The patient's wound looks good. His only complaint is that his ankle is swollen. He has no calf tenderness. He did have a percutaneous fasciotomy of his anterior compartment and he's developed some bleeding around the ankle and swelling  I wrapped him from toes to mid thigh to help with the swelling I changed his dressing and placed him back in an immobilizer  He will come back for staple removal x-ray and hinged brace application continue nonweightbearing follow-up postop day 13

## 2014-09-29 ENCOUNTER — Encounter: Payer: Self-pay | Admitting: Orthopedic Surgery

## 2014-10-03 ENCOUNTER — Ambulatory Visit: Payer: Self-pay | Admitting: Orthopedic Surgery

## 2014-10-06 ENCOUNTER — Encounter: Payer: Self-pay | Admitting: Orthopedic Surgery

## 2014-10-06 ENCOUNTER — Ambulatory Visit (INDEPENDENT_AMBULATORY_CARE_PROVIDER_SITE_OTHER): Payer: Self-pay

## 2014-10-06 ENCOUNTER — Ambulatory Visit (INDEPENDENT_AMBULATORY_CARE_PROVIDER_SITE_OTHER): Payer: Self-pay | Admitting: Orthopedic Surgery

## 2014-10-06 VITALS — BP 139/93 | Ht 71.0 in | Wt 178.0 lb

## 2014-10-06 DIAGNOSIS — S82142D Displaced bicondylar fracture of left tibia, subsequent encounter for closed fracture with routine healing: Secondary | ICD-10-CM

## 2014-10-06 DIAGNOSIS — S82143A Displaced bicondylar fracture of unspecified tibia, initial encounter for closed fracture: Secondary | ICD-10-CM | POA: Insufficient documentation

## 2014-10-06 MED ORDER — HYDROCODONE-ACETAMINOPHEN 10-325 MG PO TABS: 1.0000 | ORAL_TABLET | Freq: Four times a day (QID) | ORAL | Status: AC | PRN

## 2014-10-06 NOTE — Patient Instructions (Signed)
Do not weight bear more than toe touching the floor  Wear brace except to bed

## 2014-10-06 NOTE — Progress Notes (Signed)
Chief Complaint  Patient presents with  . Follow-up    follow up left leg, xray + staple removal, OTIF, DOS 09/23/14    Status post  internal fixation left tibial plateau with plate and bone graft  Postop day #13  Patient still complains of moderate discomfort but says he is improving and the swelling in the ankle has gone down  He did indicate he started applying some weight to his left leg I reminded him no weightbearing for 6 weeks  Wound looks clean  Knee flexion is almost normal  X-ray shows no change in fracture position  Hinged knee brace nonweightbearing 4 weeks x-ray 4 weeks  Change pain medication  Meds ordered this encounter  Medications  . HYDROcodone-acetaminophen (NORCO) 10-325 MG per tablet    Sig: Take 1 tablet by mouth every 6 (six) hours as needed.    Dispense:  90 tablet    Refill:  0

## 2014-11-03 ENCOUNTER — Ambulatory Visit (INDEPENDENT_AMBULATORY_CARE_PROVIDER_SITE_OTHER): Payer: Self-pay

## 2014-11-03 ENCOUNTER — Ambulatory Visit (INDEPENDENT_AMBULATORY_CARE_PROVIDER_SITE_OTHER): Payer: Self-pay | Admitting: Orthopedic Surgery

## 2014-11-03 VITALS — BP 139/47 | Ht 71.0 in | Wt 178.0 lb

## 2014-11-03 DIAGNOSIS — S82142D Displaced bicondylar fracture of left tibia, subsequent encounter for closed fracture with routine healing: Secondary | ICD-10-CM

## 2014-11-03 NOTE — Progress Notes (Signed)
Patient ID: Gabriel Skinner, male   DOB: 09/06/1990, 24 y.o.   MRN: [id number]  Follow up visit  Chief Complaint  Patient presents with  . Follow-up    4 week follow up + xrays left knee, DOS 09/23/14    BP 139/47 mmHg  Ht  (1.803 m)  Wt 178 lb (80.74 kg)  BMI 24.84 kg/m2  Encounter Diagnosis  Name Primary?  . Tibial plateau fracture, left, closed, with routine healing, subsequent encounter Yes    6 weeks tibial plateau fracture open treatment internal fixation grafting  Patient has full range of motion. His wound looks good except for one little spot last but Neosporin on that  X-rays show excellent fracture consolidation and position  Patient came in with a cane and he had been told to be nonweightbearing so not really sure what's going on with that he says he has been putting much weight on his foot.  He's been in the knee scope a hinged knee brace  I placed him in a regular economy hinged brace he can weight-bear with a cane in a brace and follow-up in 6 weeks he can't work until then. Repeat x-rays next visit.

## 2014-12-15 ENCOUNTER — Encounter: Payer: Self-pay | Admitting: Orthopedic Surgery

## 2014-12-15 ENCOUNTER — Ambulatory Visit: Payer: Self-pay | Admitting: Orthopedic Surgery
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
## 2022-10-21 ENCOUNTER — Emergency Department: Admit: 2022-10-21 | Payer: Medicaid Other

## 2022-10-21 ENCOUNTER — Inpatient Hospital Stay
Admit: 2022-10-21 | Discharge: 2022-10-21 | Disposition: A | Discharge status: Home / Self Care | Payer: Medicaid Other | Attending: Student in an Organized Health Care Education/Training Program

## 2022-10-21 DIAGNOSIS — K047 Periapical abscess without sinus: Secondary | ICD-10-CM

## 2022-10-21 LAB — CBC WITH AUTO DIFFERENTIAL
Basophils %: 0 % (ref 0–2)
Basophils Absolute: 0 10*3/uL (ref 0.0–0.1)
Eosinophils %: 1 % (ref 0–5)
Eosinophils Absolute: 0.2 10*3/uL (ref 0.0–0.4)
Hematocrit: 44.2 % (ref 36.0–48.0)
Hemoglobin: 16.1 g/dL — ABNORMAL HIGH (ref 13.0–16.0)
Immature Granulocytes %: 0 % (ref 0.0–0.5)
Immature Granulocytes Absolute: 0 10*3/uL (ref 0.00–0.04)
Lymphocytes %: 22 % (ref 21–52)
Lymphocytes Absolute: 2.6 10*3/uL (ref 0.9–3.6)
MCH: 29.7 PG (ref 24.0–34.0)
MCHC: 36.4 g/dL (ref 31.0–37.0)
MCV: 81.4 FL (ref 78.0–100.0)
MPV: 9.9 FL (ref 9.2–11.8)
Monocytes %: 6 % (ref 3–10)
Monocytes Absolute: 0.7 10*3/uL (ref 0.05–1.2)
Neutrophils %: 71 % (ref 40–73)
Neutrophils Absolute: 8.5 10*3/uL — ABNORMAL HIGH (ref 1.8–8.0)
Nucleated RBCs: 0 PER 100 WBC
Platelets: 294 10*3/uL (ref 135–420)
RBC: 5.43 M/uL (ref 4.35–5.65)
RDW: 12.3 % (ref 11.6–14.5)
WBC: 12.1 10*3/uL (ref 4.6–13.2)
nRBC: 0 10*3/uL (ref 0.00–0.01)

## 2022-10-21 LAB — COMPREHENSIVE METABOLIC PANEL
ALT: 24 U/L (ref 16–61)
AST: 22 U/L (ref 10–38)
Albumin/Globulin Ratio: 1.1 (ref 0.8–1.7)
Albumin: 4.3 g/dL (ref 3.4–5.0)
Alk Phosphatase: 114 U/L (ref 45–117)
Anion Gap: 5 mmol/L (ref 3.0–18)
BUN/Creatinine Ratio: 5 — ABNORMAL LOW (ref 12–20)
BUN: 6 MG/DL — ABNORMAL LOW (ref 7.0–18)
CO2: 26 mmol/L (ref 21–32)
Calcium: 9.1 MG/DL (ref 8.5–10.1)
Chloride: 105 mmol/L (ref 100–111)
Creatinine: 1.12 MG/DL (ref 0.6–1.3)
Est, Glom Filt Rate: 89 mL/min/{1.73_m2} (ref >60)
Globulin: 3.9 g/dL (ref 2.0–4.0)
Glucose: 110 mg/dL — ABNORMAL HIGH (ref 74–99)
Potassium: 3.3 mmol/L — ABNORMAL LOW (ref 3.5–5.5)
Sodium: 136 mmol/L (ref 136–145)
Total Bilirubin: 0.6 MG/DL (ref 0.2–1.0)
Total Protein: 8.2 g/dL (ref 6.4–8.2)

## 2022-10-21 LAB — C-REACTIVE PROTEIN: CRP: <0.3 mg/dL (ref 0–0.3)

## 2022-10-21 MED ORDER — IOPAMIDOL 61 % IV SOLN
61 | Freq: Once | INTRAVENOUS | Status: AC | PRN
Start: 2022-10-21 — End: 2022-10-21
  Administered 2022-10-21: 19:00:00 100 mL via INTRAVENOUS

## 2022-10-21 MED ORDER — ACETAMINOPHEN 500 MG PO TABS
500 | ORAL | Status: AC
Start: 2022-10-21 — End: 2022-10-21
  Administered 2022-10-21: 17:00:00 1000 mg via ORAL

## 2022-10-21 MED ORDER — KETOROLAC TROMETHAMINE 15 MG/ML IJ SOLN
15 | INTRAMUSCULAR | Status: AC
Start: 2022-10-21 — End: 2022-10-21
  Administered 2022-10-21: 17:00:00 30 mg via INTRAVENOUS

## 2022-10-21 MED ORDER — IBUPROFEN 600 MG PO TABS
600 | ORAL | Status: DC
Start: 2022-10-21 — End: 2022-10-21

## 2022-10-21 MED ORDER — IBUPROFEN 800 MG PO TABS
800 | ORAL_TABLET | Freq: Three times a day (TID) | ORAL | 0 refills | Status: AC | PRN
Start: 2022-10-21 — End: 2022-11-20

## 2022-10-21 MED ORDER — CLINDAMYCIN HCL 150 MG PO CAPS
150 | Freq: Once | ORAL | Status: AC
Start: 2022-10-21 — End: 2022-10-21
  Administered 2022-10-21: 20:00:00 450 mg via ORAL

## 2022-10-21 MED ORDER — CLINDAMYCIN HCL 150 MG PO CAPS
150 MG | ORAL_CAPSULE | Freq: Three times a day (TID) | ORAL | 0 refills | Status: AC
Start: 2022-10-21 — End: 2022-10-31

## 2022-10-21 MED FILL — ISOVUE-300 61 % IV SOLN: 61 % | INTRAVENOUS | Qty: 100

## 2022-10-21 MED FILL — CLINDAMYCIN HCL 150 MG PO CAPS: 150 MG | ORAL | Qty: 3

## 2022-10-21 MED FILL — ACETAMINOPHEN 500 MG PO TABS: 500 MG | ORAL | Qty: 2

## 2022-10-21 MED FILL — IBUPROFEN 600 MG PO TABS: 600 MG | ORAL | Qty: 1

## 2022-10-21 MED FILL — KETOROLAC TROMETHAMINE 15 MG/ML IJ SOLN: 15 MG/ML | INTRAMUSCULAR | Qty: 2

## 2022-10-21 NOTE — ED Provider Notes (Signed)
[facility] EMERGENCY DEPT  EMERGENCY DEPARTMENT ENCOUNTER    Patient Name: Christian Acevedo  MRN: [id number]  Birth Date: 1991-03-22  Provider: Audria Nine, DO   PCP: None, None   Time/Date of evaluation: 12:56 PM EDT on 10/22/22    History of Presenting Illness     Chief Complaint   Patient presents with    Dental Pain       History Provided by: Patient   History is limited by: Nothing    HISTORY [name]):   Christian Acevedo is a 32 y.o. male with otherwise in good health who presents to the emergency department via by POV complaining of right sided lower dental pain. Started about 2 days ago - waxes and wanes. Worse with chewing. +Hot/cold sensitivity. Has had issues with this same tooth in the past but went away on their own. Thinks he may have injured the tooth a few weeks back. Visiting the town from out of town but returns in a few days where he can get in with this dentist. He presents now for poorly controlled pain. Denies fevers, chills, difficulty opening or closing his mouth, difficulty swallowing, neck stiffness.      Nursing Notes were all reviewed and agreed with or any disagreements were addressed in the HPI.    Past History     PAST MEDICAL HISTORY:  No past medical history on file.    PAST SURGICAL HISTORY:  No past surgical history on file.    FAMILY HISTORY:  No family history on file.    SOCIAL HISTORY:       MEDICATIONS:  No current facility-administered medications for this encounter.     Current Outpatient Medications   Medication Sig Dispense Refill    clindamycin (CLEOCIN) 150 MG capsule Take 3 capsules by mouth 3 times daily for 10 days 90 capsule 0    ibuprofen (ADVIL;MOTRIN) 800 MG tablet Take 1 tablet by mouth every 8 hours as needed for Pain 90 tablet 0       ALLERGIES:  Allergies   Allergen Reactions    Penicillins Anaphylaxis       SOCIAL DETERMINANTS OF HEALTH:  Social Determinants of Health     Tobacco Use: Not on file   Alcohol Use: Not on file   Financial Resource Strain: Not on  file   Food Insecurity: Not on file   Transportation Needs: Not on file   Physical Activity: Not on file   Stress: Not on file   Social Connections: Not on file   Intimate Partner Violence: Not on file   Depression: Not on file   Housing Stability: Not on file   Interpersonal Safety: Not on file   Utilities: Not on file       Review of Systems     Negative except as listed above in HPI.    Physical Exam     Vitals:    10/21/22 1222 10/21/22 1223   BP:  (!) 142/84   Pulse: 61    Resp: 18    Temp: 97.5 F (36.4 C)    SpO2: 98%    Weight: 83.9 kg (185 lb)    Height: 1.778 m (5\' 10" )      Physical Exam  Vitals and nursing note reviewed.   Constitutional:       General: He is not in acute distress.     Appearance: Normal appearance. He is not ill-appearing or toxic-appearing.   HENT:  Head: Normocephalic and atraumatic. No right periorbital erythema or left periorbital erythema.      Jaw: There is normal jaw occlusion. No trismus, tenderness, swelling or pain on movement.      Comments: No facial swelling     Mouth/Throat:      Lips: Pink.      Mouth: Mucous membranes are moist.      Dentition: Dental tenderness, gingival swelling and dental caries present.      Tongue: No lesions.      Palate: No mass.      Pharynx: Oropharynx is clear. Uvula midline. No pharyngeal swelling, oropharyngeal exudate or uvula swelling.      Tonsils: No tonsillar exudate or tonsillar abscesses. 0 on the right. 0 on the left.      Comments: Tenderness over tooth 28/29 with surrounding soft tissue swelling and erythema. Small fracture of tooth 28 seen. No exposed pulp. Floor of the mouth is soft. Phonation is normal. Tolerating oral secretions.   Eyes:      General: No scleral icterus.     Extraocular Movements: Extraocular movements intact.      Conjunctiva/sclera: Conjunctivae normal.      Pupils: Pupils are equal, round, and reactive to light.   Cardiovascular:      Rate and Rhythm: Normal rate and regular rhythm.   Pulmonary:       Effort: Pulmonary effort is normal. No respiratory distress.   Musculoskeletal:         General: Normal range of motion.      Cervical back: Normal range of motion.   Skin:     General: Skin is warm and dry.      Capillary Refill: Capillary refill takes less than 2 seconds.   Neurological:      General: No focal deficit present.      Mental Status: He is alert and oriented to person, place, and time.   Psychiatric:         Mood and Affect: Mood normal.         Diagnostic Study Results     LABS:  Recent Results (from the past 200 hour(s))   CBC with Auto Differential    Collection Time: 10/21/22 12:59 PM   Result Value Ref Range    WBC 12.1 4.6 - 13.2 K/uL    RBC 5.43 4.35 - 5.65 M/uL    Hemoglobin 16.1 (H) 13.0 - 16.0 g/dL    Hematocrit 16.1 09.6 - 48.0 %    MCV 81.4 78.0 - 100.0 FL    MCH 29.7 24.0 - 34.0 PG    MCHC 36.4 31.0 - 37.0 g/dL    RDW 04.5 40.9 - 81.1 %    Platelets 294 135 - 420 K/uL    MPV 9.9 9.2 - 11.8 FL    Nucleated RBCs 0.0 0 PER 100 WBC    nRBC 0.00 0.00 - 0.01 K/uL    Neutrophils % 71 40 - 73 %    Lymphocytes % 22 21 - 52 %    Monocytes % 6 3 - 10 %    Eosinophils % 1 0 - 5 %    Basophils % 0 0 - 2 %    Immature Granulocytes % 0 0.0 - 0.5 %    Neutrophils Absolute 8.5 (H) 1.8 - 8.0 K/UL    Lymphocytes Absolute 2.6 0.9 - 3.6 K/UL    Monocytes Absolute 0.7 0.05 - 1.2 K/UL    Eosinophils Absolute 0.2 0.0 - 0.4  K/UL    Basophils Absolute 0.0 0.0 - 0.1 K/UL    Immature Granulocytes Absolute 0.0 0.00 - 0.04 K/UL    Differential Type AUTOMATED     CMP    Collection Time: 10/21/22 12:59 PM   Result Value Ref Range    Sodium 136 136 - 145 mmol/L    Potassium 3.3 (L) 3.5 - 5.5 mmol/L    Chloride 105 100 - 111 mmol/L    CO2 26 21 - 32 mmol/L    Anion Gap 5 3.0 - 18 mmol/L    Glucose 110 (H) 74 - 99 mg/dL    BUN 6 (L) 7.0 - 18 MG/DL    Creatinine 1.61 0.6 - 1.3 MG/DL    BUN/Creatinine Ratio 5 (L) 12 - 20      Est, Glom Filt Rate 89 >60 ml/min/1.29m2    Calcium 9.1 8.5 - 10.1 MG/DL    Total Bilirubin 0.6 0.2  - 1.0 MG/DL    ALT 24 16 - 61 U/L    AST 22 10 - 38 U/L    Alk Phosphatase 114 45 - 117 U/L    Total Protein 8.2 6.4 - 8.2 g/dL    Albumin 4.3 3.4 - 5.0 g/dL    Globulin 3.9 2.0 - 4.0 g/dL    Albumin/Globulin Ratio 1.1 0.8 - 1.7     C-Reactive Protein    Collection Time: 10/21/22 12:59 PM   Result Value Ref Range    CRP <0.3 0 - 0.3 mg/dL       RADIOLOGIC STUDIES:   Non x-ray images such as CT, Ultrasound and MRI are read by the radiologist. X-ray images are visualized and preliminarily interpreted by the ED Provider with the findings as listed in the ED Course section below.     Interpretation per the Radiologist is listed below, if available at the time of this note:    CT SOFT TISSUE NECK W CONTRAST   Final Result   Odontogenic soft tissue infection along the right mandibular body   with associated odontogenic subperiosteal abscess measuring up to 0.9 x 0.3 x   1.0 cm along carious right mandibular first premolar. Clinical       There is no obvious suspicious mass lesion in the neck.         Electronically signed by Gaspar Cola          Procedures     Procedures    ED Course     I Wynonia Sours, DO) am the first provider for this patient. Initial assessment performed. I reviewed the vital signs, available nursing notes, past medical history, past surgical history, family history and social history. The patients presenting problems have been discussed, and they are in agreement with the care plan formulated and outlined with them.  I have encouraged them to ask questions as they arise throughout their visit.    RECORDS REVIEWED: Nursing Notes and Old Medical Records    MEDICATIONS ADMINISTERED IN THE ED:  Medications   acetaminophen (TYLENOL) tablet 1,000 mg (1,000 mg Oral Given 10/21/22 1255)   ketorolac (TORADOL) injection 30 mg (30 mg IntraVENous Given 10/21/22 1255)   iopamidol (ISOVUE-300) 61 % injection 100 mL (100 mLs IntraVENous Given 10/21/22 1436)   clindamycin (CLEOCIN) capsule 450 mg (450 mg Oral  Given 10/21/22 1544)            None    Medical Decision Making     Is this patient to be included in the  SEP-1 core measure? No Exclusion criteria - the patient is NOT to be included for SEP-1 Core Measure due to: 2+ SIRS criteria are not met    DDX: dental infection, dental fracture, dental caries, cellulitis, osteomyelitis, abscess, ludwigs, PTA, RPA, sepsis     DISCUSSION:  Severity: This appears to be a moderate condition.     32 y.o. male, immunocompetent, who presents with 2 days of R lower dental pain, swelling. Vitals on arrival stable. He is well appearing, nontoxic, no distress. Airway patent. Exam shows poor dentition with superimposed soft tissue infection right lower molar. See exam for additional details. No trismus, difficulty tolerating oral secretions, phonation changes to suggest more severe infection. CT imaging was obtained to assess for a more severe infection such as osteo or ludwigs, which ultimately showed a focal dental infection with developing abscess. There was no focal point of fluctuance that was amenable to bedside drainage. He was treated with antiinflammatories and started on antibiotics. He has an appointment to see his dentist in 1 week back in his home town. He was advised of the importance of nsaids, antibiotic compliance. Strict ER return precautions given.     Critical Care Time: none    Wynonia Sours, DO    Diagnosis and Disposition     Disposition: DISPOSITION Decision To Discharge 10/21/2022 03:39:38 PM    DISCHARGE NOTE:  Christian Acevedo's  results have been reviewed with him.  He has been counseled regarding his diagnosis, treatment, and plan.  He verbally conveys understanding and agreement of the signs, symptoms, diagnosis, treatment and prognosis and additionally agrees to follow up as discussed.  He also agrees with the care-plan and conveys that all of his questions have been answered.  I have also provided discharge instructions for him that include: educational  information regarding their diagnosis and treatment, and list of reasons why they would want to return to the ED prior to their follow-up appointment, should his condition change. He has been provided with education for proper emergency department utilization.     CLINICAL IMPRESSION:  1. Dental infection        PLAN:  D/C Home    DISCHARGE MEDICATIONS:  Discharge Medication List as of 10/21/2022  3:51 PM             Details   clindamycin (CLEOCIN) 150 MG capsule Take 3 capsules by mouth 3 times daily for 10 days, Disp-90 capsule, R-0Normal      ibuprofen (ADVIL;MOTRIN) 800 MG tablet Take 1 tablet by mouth every 8 hours as needed for Pain, Disp-90 tablet, R-0Normal             DISCONTINUED MEDICATIONS:  Discharge Medication List as of 10/21/2022  3:51 PM          PATIENT REFERRED TO:  Follow Up with:  No follow-up provider specified.        I Wynonia Sours, DO, am the primary clinician of record.    Dragon Disclaimer     Please note that this dictation was completed with Dragon, the [employer] account planner.  Quite often unanticipated grammatical, syntax, homophones, and other interpretive errors are inadvertently transcribed by the computer software.  Please disregard these errors.  Please excuse any errors that have escaped final proofreading.    Wynonia Sours, DO  (Electronically signed)            Raechel Chute B, DO  10/23/22 2326

## 2022-10-21 NOTE — Discharge Instructions (Addendum)
You were seen today for your dental pain. We recommend very close follow up within 1 week with your dentist for definitive management of your infection. You should continue to take tylenol and ibuprofen. You can take up to 800mg  ibuprofen every 4-6 hours and tylenol 1000mg  every 8 hours.     Take the antibiotics to completion. Take these with food and a meal to avoid stomach upset. You may also benefit from a probiotic.     Return to the ER if you have trouble opening or closing your mouth, trouble swallowing your secretions or salvia, are getting worse despite the above or have any other concerns.

## 2022-10-21 NOTE — ED Triage Notes (Signed)
Pt arrives to ed reporting right sided lower dental pain that began last night.
